# Patient Record
Sex: Male | Born: 1975 | Race: White | Hispanic: No | Marital: Married | Smoking: Former smoker
Health system: Southern US, Community
[De-identification: ages and names within clinical notes are randomized; demographics above are authoritative.]

## PROBLEM LIST (undated history)

## (undated) DIAGNOSIS — K7460 Unspecified cirrhosis of liver: Secondary | ICD-10-CM

## (undated) DIAGNOSIS — B1920 Unspecified viral hepatitis C without hepatic coma: Secondary | ICD-10-CM

## (undated) HISTORY — DX: Unspecified cirrhosis of liver: K74.60

## (undated) HISTORY — PX: EXTERNAL EAR SURGERY: SHX627

## (undated) HISTORY — DX: Unspecified viral hepatitis C without hepatic coma: B19.20

---

## 1995-07-27 DIAGNOSIS — B1920 Unspecified viral hepatitis C without hepatic coma: Secondary | ICD-10-CM

## 1995-07-27 HISTORY — DX: Unspecified viral hepatitis C without hepatic coma: B19.20

## 2014-08-23 ENCOUNTER — Ambulatory Visit (INDEPENDENT_AMBULATORY_CARE_PROVIDER_SITE_OTHER): Payer: 59 | Admitting: Family Medicine

## 2014-08-23 ENCOUNTER — Encounter: Payer: Self-pay | Admitting: Family Medicine

## 2014-08-23 VITALS — BP 141/87 | HR 70 | Ht 71.0 in | Wt 228.0 lb

## 2014-08-23 DIAGNOSIS — E209 Hypoparathyroidism, unspecified: Secondary | ICD-10-CM | POA: Insufficient documentation

## 2014-08-23 DIAGNOSIS — M1710 Unilateral primary osteoarthritis, unspecified knee: Secondary | ICD-10-CM | POA: Insufficient documentation

## 2014-08-23 DIAGNOSIS — F1191 Opioid use, unspecified, in remission: Secondary | ICD-10-CM | POA: Insufficient documentation

## 2014-08-23 DIAGNOSIS — Z87898 Personal history of other specified conditions: Secondary | ICD-10-CM | POA: Insufficient documentation

## 2014-08-23 DIAGNOSIS — B182 Chronic viral hepatitis C: Secondary | ICD-10-CM

## 2014-08-23 DIAGNOSIS — M13169 Monoarthritis, not elsewhere classified, unspecified knee: Secondary | ICD-10-CM

## 2014-08-23 DIAGNOSIS — D696 Thrombocytopenia, unspecified: Secondary | ICD-10-CM | POA: Insufficient documentation

## 2014-08-23 DIAGNOSIS — Z8659 Personal history of other mental and behavioral disorders: Secondary | ICD-10-CM

## 2014-08-23 DIAGNOSIS — Z2252 Carrier of viral hepatitis C: Secondary | ICD-10-CM

## 2014-08-23 LAB — COMPLETE METABOLIC PANEL WITH GFR
ALBUMIN: 4.4 g/dL (ref 3.5–5.2)
ALT: 99 U/L — ABNORMAL HIGH (ref 0–53)
AST: 49 U/L — ABNORMAL HIGH (ref 0–37)
Alkaline Phosphatase: 54 U/L (ref 39–117)
BUN: 15 mg/dL (ref 6–23)
CO2: 28 mEq/L (ref 19–32)
Calcium: 9.2 mg/dL (ref 8.4–10.5)
Chloride: 103 mEq/L (ref 96–112)
Creat: 0.86 mg/dL (ref 0.50–1.35)
GFR, Est African American: >89 mL/min
Glucose, Bld: 88 mg/dL (ref 70–99)
Potassium: 4.4 mEq/L (ref 3.5–5.3)
SODIUM: 141 meq/L (ref 135–145)
Total Bilirubin: 0.7 mg/dL (ref 0.2–1.2)
Total Protein: 7 g/dL (ref 6.0–8.3)

## 2014-08-23 LAB — CBC
HCT: 46.4 % (ref 39.0–52.0)
Hemoglobin: 16.2 g/dL (ref 13.0–17.0)
MCH: 30.9 pg (ref 26.0–34.0)
MCHC: 34.9 g/dL (ref 30.0–36.0)
MCV: 88.5 fL (ref 78.0–100.0)
MPV: 12.7 fL — ABNORMAL HIGH (ref 8.6–12.4)
Platelets: 161 10*3/uL (ref 150–400)
RBC: 5.24 MIL/uL (ref 4.22–5.81)
RDW: 13.6 % (ref 11.5–15.5)
WBC: 4.3 10*3/uL (ref 4.0–10.5)

## 2014-08-23 NOTE — Progress Notes (Signed)
CC: Jimmy Gonzalez is a 39 y.o. 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 He has not used any intravenous drugs or abused any medications since rehabilitation 15 years ago. He denies right upper quadrant pain jaundice  Nor nausea recently or remotely.  On outside records  It shows that he has a genotype 1b  And in 2011 his hep C RNA quantitative  Result was 30,000. He tells me that he has never been offered therapy because of  A chronic leukocytosis. He was told to follow-up with hepatology in 2016.  Most recent LFTs are from 2013 showing a mildly elevated ALT and normal AST.   history of hyperparathyroidism that was found  About 5 years ago during a hypocalcemia workup. He currently sees an endocrinologist in [place] and is taking calcitriol. Most recent calcium level in  February 2015 was 8.4.  He denies any recent motor or sensory disturbances   history of thrombocytopenia and  Leukopenia. He tells me that he had a bone marrow biopsy sometime around 2010 that was normal  And that these findings were attribute mild splenomegaly.   Complains of bilateral knee pain right greater than left that comes and goes for the last couple years. Most recent episode occurred around the holidays and lasted a week. It typically lasts a week proceeded by any particular event or actions. Will go away within a week without any particular interventions. It is accompanied by no swelling redness or warmth or instability when it occurs. He denies any recent or remote trauma or overexertion. When it occurred most recently it was worse with climbing up stairs, walking but absent when at rest. No workup has been done for this.   Review of Systems - General ROS: negative for - chills, fever, night sweats, weight gain or weight loss Ophthalmic  ROS: negative for - decreased vision Psychological ROS: negative for - anxiety or depression ENT ROS: negative for - hearing change, nasal congestion, tinnitus or allergies Hematological and Lymphatic ROS: negative for - bleeding problems, bruising or swollen lymph nodes Breast ROS: negative Respiratory ROS: no cough, shortness of breath, or wheezing Cardiovascular ROS: no chest pain or dyspnea on exertion Gastrointestinal ROS: no abdominal pain, change in bowel habits, or black or bloody stools Genito-Urinary ROS: negative for - genital discharge, genital ulcers, incontinence or abnormal bleeding from genitals Musculoskeletal ROS: negative for -current joint pain or muscle pain Neurological ROS: negative for - headaches or memory loss Dermatological ROS: negative for lumps, mole changes, rash and skin lesion changes  Past Medical History  Diagnosis Date  . Hepatitis C 1997    Past Surgical History  Procedure Laterality Date  . External ear surgery  [phone]   History reviewed. No pertinent family history.  History   Social History  . Marital Status: Married    Spouse Name: N/A    Number of Children: N/A  . Years of Education: N/A   Occupational History  . Not on file.   Social History Main Topics  . Smoking status: Former Research scientist (life sciences)  . Smokeless tobacco: Not on file  . Alcohol Use: No  . Drug Use: No  . Sexual Activity:    Partners: Female   Other Topics Concern  . Not on file   Social History Narrative  .  No narrative on file     Objective: BP 141/87 mmHg  Pulse 70  Ht $R'5\' 11"'Ce$  (1.803 m)  Wt 228 lb (103.42 kg)  BMI 31.81 kg/m2  General: Alert and Oriented, No Acute Distress HEENT: Pupils equal, round, reactive to light. Conjunctivae clear.  Moist mucous membranes Lungs: Clear to auscultation bilaterally, no wheezing/ronchi/rales.  Comfortable work of breathing. Good air movement. Cardiac: Regular rate and rhythm. Normal S1/S2.  No murmurs, rubs, nor  gallops.   Abdomen: Normal bowel sounds, soft and non tender without palpable masses. Right knee exam shows full-strength and range of motion. There is no swelling, redness, nor warmth overlying the knee.  No patellar crepitus. No patellar apprehension. No pain with palpation of the inferior patellar pole.  No pain or laxity with valgus nor varus stress. Anterior drawer is negative. McMurray's negative. No popliteal space tenderness or palpable mass. No medial or lateral joint line tenderness to palpation. Genu Varus Extremities: No peripheral edema.  Strong peripheral pulses.  Mental Status: No depression, anxiety, nor agitation. Skin: Warm and dry.  Assessment & Plan: Jimmy Gonzalez was seen today for establish care.  Diagnoses and associated orders for this visit:  Hepatitis C virus carrier state - COMPLETE METABOLIC PANEL WITH GFR - AMB referral to hepatitis C clinic  Hypoparathyroidism - COMPLETE METABOLIC PANEL WITH GFR  Thrombocytopenia - CBC  Patellofemoral arthritis, unspecified laterality  History of heroin use    Hepatitis C: We'll refer to [facility] for further management Hypoparathyroidism: Clinically controlled due for calcium and albumin Thrombocytopenia: Clinically controlled due for CBC Patellofemoral arthritis: Provided with a home exercise plan to help prevent future episodes of pain and that can be used when any flareup occurs.   Return in about 3 months (around 11/22/2014) for Routine Follow Up.

## 2014-08-28 ENCOUNTER — Encounter: Payer: Self-pay | Admitting: Family Medicine

## 2014-11-08 ENCOUNTER — Other Ambulatory Visit (HOSPITAL_COMMUNITY): Payer: Self-pay | Admitting: Nurse Practitioner

## 2014-11-08 ENCOUNTER — Other Ambulatory Visit (HOSPITAL_COMMUNITY): Payer: Self-pay | Admitting: Family Medicine

## 2014-11-08 DIAGNOSIS — B182 Chronic viral hepatitis C: Secondary | ICD-10-CM

## 2014-11-22 ENCOUNTER — Ambulatory Visit (INDEPENDENT_AMBULATORY_CARE_PROVIDER_SITE_OTHER): Payer: 59 | Admitting: Family Medicine

## 2014-11-22 ENCOUNTER — Encounter: Payer: Self-pay | Admitting: Family Medicine

## 2014-11-22 VITALS — BP 127/84 | HR 78 | Wt 228.0 lb

## 2014-11-22 DIAGNOSIS — H6121 Impacted cerumen, right ear: Secondary | ICD-10-CM | POA: Diagnosis not present

## 2014-11-22 DIAGNOSIS — Z2252 Carrier of viral hepatitis C: Secondary | ICD-10-CM | POA: Diagnosis not present

## 2014-11-22 DIAGNOSIS — M13169 Monoarthritis, not elsewhere classified, unspecified knee: Secondary | ICD-10-CM | POA: Diagnosis not present

## 2014-11-22 DIAGNOSIS — B182 Chronic viral hepatitis C: Secondary | ICD-10-CM

## 2014-11-22 NOTE — Progress Notes (Signed)
CC: Jimmy Gonzalez is a 39 y.o. male is here for Cerumen Impaction   Subjective: HPI:  Follow-up hepatitis C: He has begun seeing Jimmy Gonzalez at the hepatology clinic. As it stands now he will begin treatment in June. He is very optimistic about overcoming his hepatitis C infection. He is not using any intravenous drugs or alcohol. No right upper quadrant pain or jaundice. Denies any gastro-intestinal complaints  Follow-up patellofemoral syndrome: Since I saw him last his begin doing some of the exercises that were given on a rehabilitation plan he was provided with. He's also stopped doing a certain position (that he doesn't want to elaborate on) while having sex with his wife and has noticed that this is taking a large strain off of his knees and he is no longer having any symptoms. He denies any pain in the knees or swelling or overlying skin changes  Complains of decreased hearing in the right ear with a full sensation. He tells me that this happens once a year and is usually due to the cerumen impaction. He denies any complete loss of hearing, tinnitus, headache, balance issues nor drainage from the right ear.    Review Of Systems Outlined In HPI  Past Medical History  Diagnosis Date  . Hepatitis C 1997    Past Surgical History  Procedure Laterality Date  . External ear surgery  [phone],1986,2003   No family history on file.  History   Social History  . Marital Status: Married    Spouse Name: N/A  . Number of Children: N/A  . Years of Education: N/A   Occupational History  . Not on file.   Social History Main Topics  . Smoking status: Former Games developermoker  . Smokeless tobacco: Not on file  . Alcohol Use: No  . Drug Use: No  . Sexual Activity:    Partners: Female   Other Topics Concern  . Not on file   Social History Narrative     Objective: BP 127/84 mmHg  Pulse 78  Wt 228 lb (103.42 kg)  SpO2 97%  General: Alert and Oriented, No Acute Distress HEENT: Pupils equal,  round, reactive to light. Conjunctivae clear.  Left canal clear with intact TMswith appropriate landmarks.  Middle ear appears open without effusion. Right middle ear and ear canal is secured by cerumen impaction. Pink inferior turbinates.  Moist mucous membranes, pharynx without inflammation nor lesions.  Neck supple without palpable lymphadenopathy nor abnormal masses. Lungs: Clear to auscultation bilaterally, no wheezing/ronchi/rales.  Comfortable work of breathing. Good air movement. Cardiac: Regular rate and rhythm. Normal S1/S2.  No murmurs, rubs, nor gallops.   Extremities: No peripheral edema.  Strong peripheral pulses.  Mental Status: No depression, anxiety, nor agitation. Skin: Warm and dry.  Assessment & Plan: Jimmy Gonzalez was seen today for cerumen impaction.  Diagnoses and all orders for this visit:  Hepatitis C virus carrier state  Patellofemoral arthritis, unspecified laterality  Cerumen impaction, right Orders: -     Ambulatory referral to ENT   Hepatitis C: Currently asymptomatic, advised to follow-up with Jimmy Gonzalez in June for initiation of treatment Patellofemoral arthritis: Completely resolved, restart home rehabilitation if pain returns Cerumen impaction: He tells me that he's been instructed to never have his right ear irrigated due to complex anatomy, he is requesting referral to ENT which seems quite reasonable.  Return if symptoms worsen or fail to improve.

## 2014-11-27 ENCOUNTER — Ambulatory Visit (HOSPITAL_COMMUNITY)
Admission: RE | Admit: 2014-11-27 | Discharge: 2014-11-27 | Disposition: A | Discharge status: Home / Self Care | Payer: 59 | Source: Ambulatory Visit | Attending: Nurse Practitioner | Admitting: Nurse Practitioner

## 2014-11-27 DIAGNOSIS — Z87891 Personal history of nicotine dependence: Secondary | ICD-10-CM | POA: Diagnosis not present

## 2014-11-27 DIAGNOSIS — B1920 Unspecified viral hepatitis C without hepatic coma: Secondary | ICD-10-CM | POA: Insufficient documentation

## 2014-11-27 DIAGNOSIS — B182 Chronic viral hepatitis C: Secondary | ICD-10-CM

## 2014-12-17 ENCOUNTER — Encounter: Payer: Self-pay | Admitting: Family Medicine

## 2014-12-17 DIAGNOSIS — H6621 Chronic atticoantral suppurative otitis media, right ear: Secondary | ICD-10-CM | POA: Insufficient documentation

## 2015-01-16 ENCOUNTER — Telehealth: Payer: Self-pay | Admitting: *Deleted

## 2015-01-16 NOTE — Telephone Encounter (Signed)
Called patient regarding appt for Hep C and left a voice mail to call the clinic. He can be added to the July 13 schedule. Wendall Mola

## 2015-02-05 ENCOUNTER — Encounter: Payer: Self-pay | Admitting: Internal Medicine

## 2015-02-05 ENCOUNTER — Encounter: Payer: 59 | Admitting: Internal Medicine

## 2015-02-05 ENCOUNTER — Ambulatory Visit (INDEPENDENT_AMBULATORY_CARE_PROVIDER_SITE_OTHER): Payer: 59 | Admitting: Internal Medicine

## 2015-02-05 VITALS — BP 142/89 | HR 85 | Temp 97.9°F | Ht 71.0 in | Wt 227.0 lb

## 2015-02-05 DIAGNOSIS — B182 Chronic viral hepatitis C: Secondary | ICD-10-CM | POA: Diagnosis not present

## 2015-02-05 DIAGNOSIS — K7460 Unspecified cirrhosis of liver: Secondary | ICD-10-CM | POA: Diagnosis not present

## 2015-02-05 DIAGNOSIS — D696 Thrombocytopenia, unspecified: Secondary | ICD-10-CM

## 2015-02-05 MED ORDER — LEDIPASVIR-SOFOSBUVIR 90-400 MG PO TABS
1.0000 | ORAL_TABLET | Freq: Every day | ORAL | Status: DC
Start: 2015-02-05 — End: 2015-07-29

## 2015-02-05 NOTE — Addendum Note (Signed)
Addended by: Gardiner BarefootOMER, ROBERT W on: 02/05/2015 04:47 PM   Modules accepted: Orders

## 2015-02-05 NOTE — Patient Instructions (Signed)
Date 02/05/2015  Dear Mr. Caralee AtesAndrews, As discussed in the ID Clinic, your hepatitis C therapy will include the following medications:          Harvoni 90mg /400mg  tablet:           Take 1 tablet by mouth once daily   Please note that ALL MEDICATIONS WILL START ON THE SAME DATE for a total of 12 weeks. ---------------------------------------------------------------- Your HCV Treatment Start Date: TBA   Your HCV genotype:  1b    Liver Fibrosis: F4    ---------------------------------------------------------------- YOUR PHARMACY CONTACT:   [facility] [address] Phone: [phone] Hours: Monday to Friday 7:30 am to 6:00 pm   Please always contact your pharmacy at least 3-4 business days before you run out of medications to ensure your next month's medication is ready or 1 week prior to running out if you receive it by mail.  Remember, each prescription is for 28 days. ---------------------------------------------------------------- GENERAL NOTES REGARDING YOUR HEPATITIS C MEDICATION:  SOFOSBUVIR/LEDIPASVIR (HARVONI): - Harvoni tablet is taken daily with OR without food. - The tablets are orange. - The tablets should be stored at room temperature.  - Acid reducing agents such as H2 blockers (ie. Pepcid (famotidine), Zantac (ranitidine), Tagamet (cimetidine), Axid (nizatidine) and proton pump inhibitors (ie. Prilosec (omeprazole), Protonix (pantoprazole), Nexium (esomeprazole), or Aciphex (rabeprazole)) can decrease effectiveness of Harvoni. Do not take until you have discussed with a health care provider.    -Antacids that contain magnesium and/or aluminum hydroxide (ie. Milk of Magensia, Rolaids, Gaviscon, Maalox, Mylanta, an dArthritis Pain Formula)can reduce absorption of Harvoni, so take them at least 4 hours before or after Harvoni.  -Calcium carbonate (calcium supplements or antacids such as Tums, Caltrate,  Os-Cal)needs to be taken at least 4 hours hours before or after Harvoni.  -St. John's wort or any products that contain St. John's wort like some herbal supplements  Please inform the office prior to starting any of these medications.  - The common side effects with Harvoni:      1. Fatigue      2. Headache      3. Nausea      4. Diarrhea      5. Insomnia   Support Path is a suite of resources designed to help patients start with HARVONI and move toward treatment completion GETTING STARTED Support Path helps patients access therapy and get off to an efficient start  Benefits investigation and prior authorization support Co-pay and other financial assistance A specialty pharmacy finder CO-PAY COUPON The HARVONI co-pay coupon may help eligible patients lower their out-of-pocket costs. With a co-pay coupon, most eligible patients may pay no more than $5 per co-pay (restrictions apply) www.harvoni.com call 70233735031-757-816-2739 Not valid for patients enrolled in government healthcare prescription drug programs, such as Medicare Part D and Medicaid. Patients in the coverage gap known as the "donut hole" also are not eligible The HARVONI co-pay coupon program will cover the out-of-pocket costs for HARVONI prescriptions up to a maximum of 25% of the catalog price of a 12-week regimen of HARVONI  Please note that this only lists the most common side effects and is NOT a comprehensive list of the potential side effects of these medications. For more information, please review the drug information sheets that come with your medication package from the pharmacy.  ---------------------------------------------------------------- GENERAL HELPFUL HINTS ON HCV THERAPY: 1. No alcohol. 2. Protect against sun-sensitivity/sunburns (wear sunglasses, hat, long sleeves, pants  and sunscreen). 3. Stay well-hydrated/well-moisturized. 4. Notify the ID Clinic of any changes in your other over-the-counter/herbal or  prescription medications. 5. If you miss a dose of your medication, take the missed dose as soon as you remember. Return to your regular time/dose schedule the next day.  6.  Do not stop taking your medications without first talking with your healthcare provider. 7.  You may take Tylenol (acetaminophen), as long as the dose is less than 2000 mg (OR no more than 4 tablets of the Tylenol Extra Strengths '500mg'$  tablet) in 24 hours. 8.  You will need to obtain routine labs and/or office visits at [facility] at weeks 4 and 12 as well as 12 and 24 weeks after completion of treatment.   Scharlene Gloss, [place] for [place] [address] [place], [place]  [phone]

## 2015-02-05 NOTE — Progress Notes (Signed)
+Jimmy Gonzalez is a 39 y.o. male who presents for initial evaluation and management of a positive Hepatitis C antibody test.  Patient tested positive in 2000. Hepatitis C risk factors present are: IV drug abuse (details: last used in 2000). Patient denies multiple sexual partners, renal dialysis, sexual contact with person with liver disease, tattoos. Patient has had other studies performed. Results: hepatitis C RNA by PCR, result: positive. Patient has not had prior treatment for Hepatitis C. Patient does not have a past history of liver disease. Patient does not have a family history of liver disease.   HPI: Here to establish Gonzalez for hepatitis C.  Was initially sent out of network to Jimmy Gonzalez but here to continue evaluation for hepatitis C treatment.  He has had elastography notable for F4 and F3, though Fibrosure with F2/A3.  LFTs have been chronically elevated.  [facility] 2.3 and 138, respectively, though in January were normal (in Epic). Other labs reviewed and is genotype 1b.        Patient does not have documented immunity to Hepatitis A. Patient does have documented immunity to Hepatitis B.     Review of Systems A comprehensive review of systems was negative.   Past Medical History  Diagnosis Date  . Hepatitis C 1997    Prior to Admission medications   Medication Sig Start Date End Date Taking? Authorizing Provider  calcitRIOL (ROCALTROL) 0.25 MCG capsule Take 0.25 mcg by mouth daily. Take two tabs twice a day   Yes Historical Provider, MD  Calcium Carbonate Antacid (TUMS PO) Take by mouth.   Yes Historical Provider, MD  loratadine (CLARITIN) 10 MG tablet Take 10 mg by mouth daily.   Yes Historical Provider, MD  Ledipasvir-Sofosbuvir (HARVONI) 90-400 MG TABS Take 1 tablet by mouth daily. 02/05/15   Jimmy Barefootobert W Jimmy Mehlhoff, MD    No Known Allergies  History  Substance Use Topics  . Smoking status: Former Games developermoker  . Smokeless tobacco: Never  Used  . Alcohol Use: No    History reviewed. No pertinent family history.    Objective:   Filed Vitals:   02/05/15 1542  BP: 142/89  Pulse: 85  Temp: 97.9 F (36.6 C)   in no apparent distress and alert HEENT: anicteric Cor RRR and No murmurs clear Bowel sounds are normal, liver is not enlarged, spleen is not enlarged peripheral pulses normal, no pedal edema, no clubbing or cyanosis negative for - jaundice, spider hemangioma, telangiectasia, palmar erythema, ecchymosis and atrophy  Laboratory Genotype: No results found for: HCVGENOTYPE HCV viral load: No results found for: HCVQUANT Lab Results  Component Value Date   WBC 4.3 08/23/2014   HGB 16.2 08/23/2014   HCT 46.4 08/23/2014   MCV 88.5 08/23/2014   PLT 161 08/23/2014    Lab Results  Component Value Date   CREATININE 0.86 08/23/2014   BUN 15 08/23/2014   NA 141 08/23/2014   K 4.4 08/23/2014   CL 103 08/23/2014   CO2 28 08/23/2014    Lab Results  Component Value Date   ALT 99* 08/23/2014   AST 49* 08/23/2014   ALKPHOS 54 08/23/2014   BILITOT 0.7 08/23/2014      Assessment: Chronic Hepatitis C genotype 1b  Plan: 1) Patient counseled extensively on limiting acetaminophen to no more than 2 grams daily, avoidance of alcohol. 2) Transmission discussed with patient including sexual transmission, sharing razors and toothbrush.   3) Will need referral to  gastroenterology, though he has discordant results with F3/4 on elastography and F2 on Fibrosure, with low platelets (scanned results) and elastography, I think varices screening is indicated.  I also discussed HCC screening every 6 months and will consider repeat elastography in 1 year.   4) Will need referral for substance abuse counseling: No. 5) Will prescribe Harvoni for 12 weeks  6) Hepatitis A vaccine Yes.  will do next visit.  7) Hepatitis B vaccine No.  He is core positive and DNA checked, is negative.   8) Pneumovax vaccine next visit due to possible  cirrhosis.   9) will follow up after starting medication   Labs from [facility] scanned in.

## 2015-02-10 ENCOUNTER — Encounter: Payer: Self-pay | Admitting: Internal Medicine

## 2015-02-23 ENCOUNTER — Emergency Department (INDEPENDENT_AMBULATORY_CARE_PROVIDER_SITE_OTHER): Payer: 59

## 2015-02-23 ENCOUNTER — Encounter: Payer: Self-pay | Admitting: Emergency Medicine

## 2015-02-23 ENCOUNTER — Emergency Department
Admission: EM | Admit: 2015-02-23 | Discharge: 2015-02-23 | Disposition: A | Discharge status: Home / Self Care | Payer: 59 | Source: Home / Self Care | Attending: Family Medicine | Admitting: Family Medicine

## 2015-02-23 DIAGNOSIS — R6889 Other general symptoms and signs: Secondary | ICD-10-CM | POA: Diagnosis not present

## 2015-02-23 DIAGNOSIS — J069 Acute upper respiratory infection, unspecified: Secondary | ICD-10-CM

## 2015-02-23 DIAGNOSIS — R509 Fever, unspecified: Secondary | ICD-10-CM

## 2015-02-23 DIAGNOSIS — R05 Cough: Secondary | ICD-10-CM | POA: Diagnosis not present

## 2015-02-23 MED ORDER — BENZONATATE 100 MG PO CAPS: 100.0000 mg | ORAL_CAPSULE | Freq: Three times a day (TID) | ORAL | Status: DC

## 2015-02-23 MED ORDER — DM-GUAIFENESIN ER 30-600 MG PO TB12: 1.0000 | ORAL_TABLET | Freq: Two times a day (BID) | ORAL | Status: DC

## 2015-02-23 NOTE — ED Notes (Signed)
Patient presents to [facility] with cough cold and congestion times 4 days. No fever patients wife is in the hospital with pneumonia

## 2015-02-23 NOTE — ED Provider Notes (Signed)
CSN: [id number]     Arrival date & time 02/23/15  1234 History   First MD Initiated Contact with Patient 02/23/15 1314     Chief Complaint  Patient presents with  . Nasal Congestion   (Consider location/radiation/quality/duration/timing/severity/associated sxs/prior Treatment) HPI The patient is a 39 year old male presenting to urgent care with complaints of flulike symptoms and is concerned he has pneumonia as his wife was recently admitted for pneumonia.  Patient states he has had nasal congestion, mild to moderately productive cough that is intermittent.  States he also has mild intermittent wheeze and shortness of breath, chest pain from coughing.  He has had nausea and small amount of loose stools.  Patient has had decreased appetite, fatigue and body aches.  He has also had headaches in dizziness.  Symptoms started about 4 days ago.  Patient states he did have a temperature up to 101.3, but that has since resolved.  States he has been taking acetaminophen and ibuprofen which has helped with symptoms.  Symptoms have been gradually improving.  However, he wanted to be evaluated just to make sure he does not have pneumonia.  Denies recent travel.  History of asthma.  Patient is a former smoker.  Past Medical History  Diagnosis Date  . Hepatitis C 1997   Past Surgical History  Procedure Laterality Date  . External ear surgery  [phone]   History reviewed. No pertinent family history. History  Substance Use Topics  . Smoking status: Former Games developer  . Smokeless tobacco: Never Used  . Alcohol Use: No    Review of Systems  Constitutional: Positive for fever ( 101.3), chills, diaphoresis, appetite change and fatigue.  HENT: Positive for congestion, sore throat and voice change ( hoarse). Negative for ear pain and trouble swallowing.   Respiratory: Positive for cough, shortness of breath and wheezing.   Cardiovascular: Positive for chest pain ( "from coughing"). Negative for  palpitations.  Gastrointestinal: Positive for nausea and diarrhea. Negative for vomiting and abdominal pain.  Musculoskeletal: Negative for myalgias, back pain and arthralgias.  Skin: Negative for rash.  Neurological: Positive for dizziness and headaches. Negative for light-headedness.  All other systems reviewed and are negative.   Allergies  Review of patient's allergies indicates no known allergies.  Home Medications   Prior to Admission medications   Medication Sig Start Date End Date Taking? Authorizing Provider  benzonatate (TESSALON) 100 MG capsule Take 1 capsule (100 mg total) by mouth every 8 (eight) hours. 02/23/15   Junius Finner, PA-C  calcitRIOL (ROCALTROL) 0.25 MCG capsule Take 0.25 mcg by mouth daily. Take two tabs twice a day    Historical Provider, MD  Calcium Carbonate Antacid (TUMS PO) Take by mouth.    Historical Provider, MD  dextromethorphan-guaiFENesin (MUCINEX DM) 30-600 MG per 12 hr tablet Take 1 tablet by mouth 2 (two) times daily. Please drink a large glass of water with each dose 02/23/15   Junius Finner, PA-C  Ledipasvir-Sofosbuvir (HARVONI) 90-400 MG TABS Take 1 tablet by mouth daily. 02/05/15   Gardiner Barefoot, MD  loratadine (CLARITIN) 10 MG tablet Take 10 mg by mouth daily.    Historical Provider, MD   BP 135/89 mmHg  Pulse 86  Temp(Src) 98 F (36.7 C) (Oral)  Resp 16  Ht 5\' 11"  (1.803 m)  Wt 223 lb 4 oz (101.266 kg)  BMI 31.15 kg/m2  SpO2 98% Physical Exam  Constitutional: He appears well-developed and well-nourished.  HENT:  Head: Normocephalic and atraumatic.  Right Ear: Hearing,  tympanic membrane, external ear and ear canal normal.  Left Ear: Hearing, tympanic membrane, external ear and ear canal normal.  Nose: Mucosal edema present.  Mouth/Throat: Uvula is midline, oropharynx is clear and moist and mucous membranes are normal.  Eyes: Conjunctivae are normal. No scleral icterus.  Neck: Normal range of motion. Neck supple.  Cardiovascular:  Normal rate, regular rhythm and normal heart sounds.   Pulmonary/Chest: Effort normal. No respiratory distress. He has no wheezes. He has rales. He exhibits no tenderness.  No respiratory distress, able to speak in full sentences w/o difficulty. No accessory muscle use. Mild intermittent productive cough. Lungs: mild diffuse rhonchi, no wheeze.   Abdominal: Soft. Bowel sounds are normal. He exhibits no distension and no mass. There is no tenderness. There is no rebound and no guarding.  Musculoskeletal: Normal range of motion.  Neurological: He is alert.  Skin: Skin is warm and dry.  Nursing note and vitals reviewed.   ED Course  Procedures (including critical care time) Labs Review Labs Reviewed - No data to display  Imaging Review Dg Chest 2 View  02/23/2015   CLINICAL DATA:  Cough x 4 days, fever, chest pressure and sob, ex-smoker x 16 yrs  EXAM: CHEST  2 VIEW  COMPARISON:  None.  FINDINGS: Normal mediastinum and cardiac silhouette. Normal pulmonary vasculature. No evidence of effusion, infiltrate, or pneumothorax. No acute bony abnormality.  IMPRESSION: Normal chest radiograph.   Electronically Signed   By: Genevive Bi M.D.   On: 02/23/2015 14:06     MDM   1. Acute upper respiratory infection   2. Flu-like symptoms    Flu-like symptoms. Wife recently admitted for pneumonia. Pt appears well, non-toxic. Afebrile. Rhonchi on Lung exam w/o respiratory distress. CXR: normal chest, no evidence of effusion, infiltrate or pneumothorax. Symptoms only being 4 days and improving per pt, will continue to tx symptomatically.  Likely viral URI. Advised pt to use acetaminophen and ibuprofen as needed for fever and pain. Rx: mucinex and tessalon Encouraged rest and fluids. F/u with PCP later this week if not improving. Return precautions provided. Pt verbalized understanding and agreement with tx plan.     Junius Finner, PA-C 02/23/15 1454

## 2015-02-23 NOTE — Discharge Instructions (Signed)

## 2015-02-27 ENCOUNTER — Telehealth: Payer: Self-pay | Admitting: Lab

## 2015-02-27 NOTE — Telephone Encounter (Signed)
See contact notes 

## 2015-03-21 ENCOUNTER — Ambulatory Visit (INDEPENDENT_AMBULATORY_CARE_PROVIDER_SITE_OTHER): Payer: 59 | Admitting: Internal Medicine

## 2015-03-21 ENCOUNTER — Encounter: Payer: Self-pay | Admitting: Internal Medicine

## 2015-03-21 VITALS — BP 118/76 | HR 72 | Ht 71.5 in | Wt 227.4 lb

## 2015-03-21 DIAGNOSIS — B182 Chronic viral hepatitis C: Secondary | ICD-10-CM | POA: Diagnosis not present

## 2015-03-21 DIAGNOSIS — K769 Liver disease, unspecified: Secondary | ICD-10-CM | POA: Diagnosis not present

## 2015-03-21 DIAGNOSIS — R932 Abnormal findings on diagnostic imaging of liver and biliary tract: Secondary | ICD-10-CM | POA: Diagnosis not present

## 2015-03-21 NOTE — Patient Instructions (Signed)
You have been scheduled for an endoscopy. Please follow written instructions given to you at your visit today. If you use inhalers (even only as needed), please bring them with you on the day of your procedure.  I appreciate the opportunity to care for you.  

## 2015-03-21 NOTE — Progress Notes (Signed)
  Referred by Dr. Luciana Axe Subjective:    Patient ID: Jimmy Gonzalez, male    DOB: [birth year], 39 y.o.   MRN: [id number] Cc: ? 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 Has hx of thrombocytopenia and leukopenia - waxing and waning and also borderline splenomegaly. Here re: ? Portal hypertension, varices. GI ROS o/w neg Medications, allergies, past medical history, past surgical history, family history and social history are reviewed and updated in the EMR.   Review of Systems Rhinorrhea w/initiation HCV TX and then some mild headaches  All other ROS negative    Objective:   Physical Exam  118/76 mmHg  Pulse 72  Ht 5' 11.5" (1.816 m)  Wt 227 lb 6 oz (103.137 kg)  BMI 31.27 kg/m2@  General:  Well-developed, well-nourished and in no acute distress Eyes:  anicteric. Lungs: Clear to auscultation bilaterally. Heart:  S1S2, no rubs, murmurs, gallops. Abdomen:  soft, non-tender, + spleen tip palable, nohernia, or mass and BS+. . Skin   no rash. No stigmata of liver dz.  Psych:  appropriate mood and  Affect.   Data Reviewed: [place] w/ elastography [facility] notes Labs  All in EMR    Assessment & Plan:  Abnormal liver ultrasound - Plan: Ambulatory referral to Gastroenterology  Chronic liver disease - Plan: Ambulatory referral to Gastroenterology  Chronic hepatitis C without hepatic coma - Plan: Ambulatory referral to Gastroenterology  Seems like a low but real possibility of having varices so will screen - elastography probably over estimating fibrosis - may have some fatty liver also Have discussed w/ patient and Annamarie Major, NP - plan for EGD to screen fr varices/portal gastropathy  I appreciate the opportunity to care for this patient. Cc: Merceda Elks, MD; Annamarie Major, NP and The Patient

## 2015-03-21 NOTE — Assessment & Plan Note (Signed)
On Tx

## 2015-04-10 ENCOUNTER — Ambulatory Visit (AMBULATORY_SURGERY_CENTER): Payer: 59 | Admitting: Internal Medicine

## 2015-04-10 ENCOUNTER — Encounter: Payer: Self-pay | Admitting: Internal Medicine

## 2015-04-10 VITALS — BP 116/78 | HR 70 | Temp 97.0°F | Resp 20 | Ht 71.5 in | Wt 227.0 lb

## 2015-04-10 DIAGNOSIS — K7460 Unspecified cirrhosis of liver: Secondary | ICD-10-CM

## 2015-04-10 DIAGNOSIS — R932 Abnormal findings on diagnostic imaging of liver and biliary tract: Secondary | ICD-10-CM

## 2015-04-10 MED ORDER — SODIUM CHLORIDE 0.9 % IV SOLN
500.0000 mL | INTRAVENOUS | Status: DC
Start: 2015-04-10 — End: 2015-04-11

## 2015-04-10 NOTE — Op Note (Signed)
[facility] [address] [place], [address]   ENDOSCOPY PROCEDURE REPORT  PATIENT: Jimmy Gonzalez, Jimmy Gonzalez  MR#: [id number] BIRTHDATE: [birth year] , 39  yrs. old GENDER: male ENDOSCOPIST: Iva Boop, MD, [facility] PROCEDURE DATE:  04/10/2015 PROCEDURE:  EGD, diagnostic and EGD, screening ASA CLASS:     Class III INDICATIONS:  screening for varices. MEDICATIONS: Propofol 160 mg IV TOPICAL ANESTHETIC: none  DESCRIPTION OF PROCEDURE: After the risks benefits and alternatives of the procedure were thoroughly explained, informed consent was obtained.  The LB [id number] endoscope was introduced through the mouth and advanced to the second portion of the duodenum , Without limitations.  The instrument was slowly withdrawn as the mucosa was fully examined.      EXAM: The esophagus and gastroesophageal junction were completely normal in appearance.  The stomach was entered and closely examined.The antrum, angularis, and lesser curvature were well visualized, including a retroflexed view of the cardia and fundus. The stomach wall was normally distensable.  The scope passed easily through the pylorus into the duodenum.  Retroflexed views revealed no abnormalities.     The scope was then withdrawn from the patient and the procedure completed.  COMPLICATIONS: There were no immediate complications.  ENDOSCOPIC IMPRESSION: Normal appearing esophagus and GE junction, the stomach was well visualized and normal in appearance, normal appearing duodenum  RECOMMENDATIONS: Repeat EGD 3 yrs (Has HCV cirrhosis - Child's A) - sooner if decompensates    eSigned:  Iva Boop, MD, [facility] 04/10/2015 10:18 AM    CC:The Patient and Annamarie Major, NP

## 2015-04-10 NOTE — Patient Instructions (Addendum)
This looks ok  Should repeat an endoscopy in 3 years - sooner if you have decompensation of liver disease.  I appreciate the opportunity to care for you. Iva Boop, MD, [facility]  Discharge instructions given. Normal exam. Resume previous medications. YOU HAD AN ENDOSCOPIC PROCEDURE TODAY AT THE [facility]:   Refer to the procedure report that was given to you for any specific questions about what was found during the examination.  If the procedure report does not answer your questions, please call your gastroenterologist to clarify.  If you requested that your care partner not be given the details of your procedure findings, then the procedure report has been included in a sealed envelope for you to review at your convenience later.  YOU SHOULD EXPECT: Some feelings of bloating in the abdomen. Passage of more gas than usual.  Walking can help get rid of the air that was put into your GI tract during the procedure and reduce the bloating. If you had a lower endoscopy (such as a colonoscopy or flexible sigmoidoscopy) you may notice spotting of blood in your stool or on the toilet paper. If you underwent a bowel prep for your procedure, you may not have a normal bowel movement for a few days.  Please Note:  You might notice some irritation and congestion in your nose or some drainage.  This is from the oxygen used during your procedure.  There is no need for concern and it should clear up in a day or so.  SYMPTOMS TO REPORT IMMEDIATELY:   Following lower endoscopy (colonoscopy or flexible sigmoidoscopy):  Excessive amounts of blood in the stool  Significant tenderness or worsening of abdominal pains  Swelling of the abdomen that is new, acute  Fever of 100F or higher  For urgent or emergent issues, a gastroenterologist can be reached at any hour by calling [phone].   DIET: Your first meal following the procedure should be a small meal and then it is ok to  progress to your normal diet. Heavy or fried foods are harder to digest and may make you feel nauseous or bloated.  Likewise, meals heavy in dairy and vegetables can increase bloating.  Drink plenty of fluids but you should avoid alcoholic beverages for 24 hours.  ACTIVITY:  You should plan to take it easy for the rest of today and you should NOT DRIVE or use heavy machinery until tomorrow (because of the sedation medicines used during the test).    FOLLOW UP: Our staff will call the number listed on your records the next business day following your procedure to check on you and address any questions or concerns that you may have regarding the information given to you following your procedure. If we do not reach you, we will leave a message.  However, if you are feeling well and you are not experiencing any problems, there is no need to return our call.  We will assume that you have returned to your regular daily activities without incident.  If any biopsies were taken you will be contacted by phone or by letter within the next 1-3 weeks.  Please call us at [phone] if you have not heard about the biopsies in 3 weeks.    SIGNATURES/CONFIDENTIALITY: You and/or your care partner have signed paperwork which will be entered into your electronic medical record.  These signatures attest to the fact that that the information above on your After Visit Summary has been reviewed and  is understood.  Full responsibility of the confidentiality of this discharge information lies with you and/or your care-partner.

## 2015-04-10 NOTE — Progress Notes (Signed)
Report to PACU, RN, vss, BBS= Clear.  

## 2015-04-11 ENCOUNTER — Telehealth: Payer: Self-pay | Admitting: *Deleted

## 2015-04-11 NOTE — Telephone Encounter (Signed)
  Follow up Call-  Call back number 04/10/2015  Post procedure Call Back phone  # [phone]  Permission to leave phone message Yes     Patient questions:  Do you have a fever, pain , or abdominal swelling? No. Pain Score  0 *  Have you tolerated food without any problems? Yes.    Have you been able to return to your normal activities? Yes.    Do you have any questions about your discharge instructions: Diet   No. Medications  No. Follow up visit  No.  Do you have questions or concerns about your Care? No.  Actions: * If pain score is 4 or above: No action needed, pain <4.

## 2015-05-07 ENCOUNTER — Emergency Department (INDEPENDENT_AMBULATORY_CARE_PROVIDER_SITE_OTHER)
Admission: EM | Admit: 2015-05-07 | Discharge: 2015-05-07 | Disposition: A | Discharge status: Home / Self Care | Payer: 59 | Source: Home / Self Care

## 2015-05-07 DIAGNOSIS — Z23 Encounter for immunization: Secondary | ICD-10-CM | POA: Diagnosis not present

## 2015-05-07 MED ORDER — INFLUENZA VAC SPLIT QUAD 0.5 ML IM SUSY
0.5000 mL | PREFILLED_SYRINGE | INTRAMUSCULAR | Status: AC
Start: 2015-05-07 — End: 2015-05-07
  Administered 2015-05-07: 0.5 mL via INTRAMUSCULAR

## 2015-05-07 NOTE — ED Notes (Signed)
The pt is here today for an influenza vaccine.   

## 2015-07-29 ENCOUNTER — Encounter: Payer: Self-pay | Admitting: Family Medicine

## 2015-07-29 ENCOUNTER — Ambulatory Visit (INDEPENDENT_AMBULATORY_CARE_PROVIDER_SITE_OTHER): Payer: 59 | Admitting: Family Medicine

## 2015-07-29 VITALS — BP 136/83 | HR 75 | Temp 98.2°F | Wt 229.0 lb

## 2015-07-29 DIAGNOSIS — H65192 Other acute nonsuppurative otitis media, left ear: Secondary | ICD-10-CM

## 2015-07-29 DIAGNOSIS — H65199 Other acute nonsuppurative otitis media, unspecified ear: Secondary | ICD-10-CM | POA: Insufficient documentation

## 2015-07-29 MED ORDER — AMOXICILLIN-POT CLAVULANATE 875-125 MG PO TABS: 1.0000 | ORAL_TABLET | Freq: Two times a day (BID) | ORAL | Status: DC

## 2015-07-29 MED ORDER — PREDNISONE 10 MG PO TABS: 30.0000 mg | ORAL_TABLET | Freq: Every day | ORAL | Status: DC

## 2015-07-29 NOTE — Assessment & Plan Note (Signed)
Likely due to eustachian dysfunction from recurrent viral illness. Will prednisone as well as antibiotics to treat likely infected effusion.

## 2015-07-29 NOTE — Patient Instructions (Signed)
Thank you for coming in today. You were seen for your ongoing illness and ear pressure. We think this is due to your eustachian tubes being blocked by viral illness causing swelling of the tubes. We recommend prednisone as an antiinflammatory to open these tubes up as well as antibiotics to treat infection in the ear.  Please return for continued or worsening symptoms.

## 2015-07-29 NOTE — Progress Notes (Signed)
       Jimmy Gonzalez is a 40 y.o. male who [facility] today for URI symptoms and ear pressure.  Patient has noted symptoms for last 1 month. Patient first noted nasal congestion and rhinorrhea which he notes has been off an don for over a month, the rhinorrhea subsided late last week and the nasal congestion is improving. Accompanying this is a cough productive of green mucus, which subsided late last week. Three days ago, he began to experience ear pressure, more prominent in L ear. He says this feels like he is "in the mountains or on a plane and can't get his ears to pop). 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  He has tried sudafed, dayquil, benadryl, none of which have helped. He has been eating and drinking well. He denies fevers, aches, chills, nausea, vomiting, chest pain, hemoptysis. Patient notes he had a serious ear infection in R ear as a child and is now deaf in that ear.    Past Medical History  Diagnosis Date  . Hepatitis C 1997  . Cirrhosis (HCC)     ???   Past Surgical History  Procedure Laterality Date  . External ear surgery Left [phone],1986,2003   Social History  Substance Use Topics  . Smoking status: Former Games developermoker  . Smokeless tobacco: Never Used  . Alcohol Use: No   family history includes Diabetes in his father and mother. There is no history of Colon cancer.  ROS as above Medications: Current Outpatient Prescriptions  Medication Sig Dispense Refill  . calcitRIOL (ROCALTROL) 0.25 MCG capsule Take 0.25 mcg by mouth daily. Take two tabs twice a day    . Calcium Carbonate Antacid (TUMS PO) Take by mouth.    Marland Kitchen. amoxicillin-clavulanate (AUGMENTIN) 875-125 MG tablet Take 1 tablet by mouth 2 (two) times daily. 20 tablet 0  . predniSONE (DELTASONE) 10 MG tablet Take 3 tablets (30 mg total) by  mouth daily. 15 tablet 0   No current facility-administered medications for this visit.   No Known Allergies   Exam:  BP 136/83 mmHg  Pulse 75  Temp(Src) 98.2 F (36.8 C) (Oral)  Wt 229 lb (103.874 kg)  SpO2 96% Gen: Slightly ill-appearing gentleman in NAD HEENT: EOMI,  MMM. OP without erythema or exudate. L ear: retracted TM, gray, translucent, no fluid appreciated though limited due to deviation of auditory canal R ear: scarred, opaque white and red TM, no overt signs of infection  Lungs: Normal work of breathing. CTABL no wheeze or rhonchi  Heart: RRR no MRG Abd: NABS, Soft. Nondistended, Nontender Exts: Brisk capillary refill, warm and well perfused.   No results found for this or any previous visit (from the past 24 hour(s)). No results found.  Tympanogram: Left shifted to -200 for pressure with blunted peak  Please see individual assessment and plan sections.

## 2015-07-31 DIAGNOSIS — B182 Chronic viral hepatitis C: Secondary | ICD-10-CM | POA: Diagnosis not present

## 2015-08-17 MED FILL — CALCITRIOL 0.25 MCG CAPSULE: 0.25 | 30 days supply | Qty: 120 | Fill #3

## 2015-09-16 DIAGNOSIS — E208 Other hypoparathyroidism: Secondary | ICD-10-CM | POA: Diagnosis not present

## 2015-09-17 MED FILL — CALCITRIOL 0.25 MCG CAPSULE: 0.25 | 30 days supply | Qty: 120 | Fill #0

## 2015-10-23 DIAGNOSIS — B182 Chronic viral hepatitis C: Secondary | ICD-10-CM | POA: Diagnosis not present

## 2015-10-23 MED FILL — CALCITRIOL 0.25 MCG CAPSULE: 0.25 | 30 days supply | Qty: 120 | Fill #1

## 2015-10-27 ENCOUNTER — Other Ambulatory Visit: Payer: Self-pay | Admitting: Nurse Practitioner

## 2015-10-27 DIAGNOSIS — B182 Chronic viral hepatitis C: Secondary | ICD-10-CM | POA: Diagnosis not present

## 2015-10-27 DIAGNOSIS — K740 Hepatic fibrosis: Secondary | ICD-10-CM | POA: Diagnosis not present

## 2015-10-27 DIAGNOSIS — K7460 Unspecified cirrhosis of liver: Secondary | ICD-10-CM

## 2015-10-27 DIAGNOSIS — K760 Fatty (change of) liver, not elsewhere classified: Secondary | ICD-10-CM | POA: Diagnosis not present

## 2015-10-31 ENCOUNTER — Ambulatory Visit
Admission: RE | Admit: 2015-10-31 | Discharge: 2015-10-31 | Disposition: A | Discharge status: Home / Self Care | Payer: 59 | Source: Ambulatory Visit | Attending: Nurse Practitioner | Admitting: Nurse Practitioner

## 2015-10-31 DIAGNOSIS — K7460 Unspecified cirrhosis of liver: Secondary | ICD-10-CM | POA: Diagnosis not present

## 2015-11-10 ENCOUNTER — Ambulatory Visit: Payer: 59 | Admitting: Family Medicine

## 2015-11-11 ENCOUNTER — Ambulatory Visit (INDEPENDENT_AMBULATORY_CARE_PROVIDER_SITE_OTHER): Payer: 59 | Admitting: Family Medicine

## 2015-11-11 ENCOUNTER — Encounter: Payer: Self-pay | Admitting: Family Medicine

## 2015-11-11 VITALS — BP 134/89 | HR 66 | Wt 233.0 lb

## 2015-11-11 DIAGNOSIS — H918X1 Other specified hearing loss, right ear: Secondary | ICD-10-CM | POA: Diagnosis not present

## 2015-11-11 DIAGNOSIS — H6121 Impacted cerumen, right ear: Secondary | ICD-10-CM | POA: Diagnosis not present

## 2015-11-11 NOTE — Progress Notes (Signed)
CC: Jimmy Gonzalez is a 40 y.o. male is here for Ear Pain   Subjective: HPI:  Right-sided ear fullness and hearing loss for the past month slowly worsening on a weekly basis. Symptoms were absent until he forgot to continue using vinegar in the ear canal a few times of the week. States that pain is mild hearing loss is moderate in severity. He started doing vinegar drops again yesterday but does seem to be helping. No other interventions as of yet. He has a history of cerumen impactions and this year. He denies nasal congestion, allergies, left ear pain, nor any other motor or sensory disturbances. Denies fevers, chills or headaches.   Review Of Systems Outlined In HPI  Past Medical History  Diagnosis Date  . Hepatitis C 1997  . Cirrhosis (HCC)     ???    Past Surgical History  Procedure Laterality Date  . External ear surgery Left [phone],1986,2003   Family History  Problem Relation Age of Onset  . Colon cancer Neg Hx   . Diabetes Father   . Diabetes Mother     Social History   Social History  . Marital Status: Married    Spouse Name: N/A  . Number of Children: N/A  . 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 Smoking status: Former Games developermoker  . Smokeless tobacco: Never Used  . Alcohol Use: No  . Drug Use: No     Comment: has been treated for substance abuse  . Sexual Activity:    Partners: Female   Other Topics Concern  . 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 Conjunctivae clear.  External ears unremarkable, left canal clear with intact TM with appropriate landmarks, right ear canal has a cerumen impaction on initial evaluation..   Middle ear appears open without effusion. Pink inferior turbinates.  Moist mucous membranes, pharynx without inflammation nor lesions.  Neck supple without palpable lymphadenopathy nor abnormal masses. Lungs: Clear to auscultation bilaterally, no wheezing/ronchi/rales.  Comfortable work of breathing. Good air movement. Extremities: No peripheral edema.  Strong peripheral pulses.  Mental Status: No depression, anxiety, nor agitation. Skin: Warm and dry.  Assessment & Plan: Casimiro NeedleMichael was seen today for ear pain.  Diagnoses and all orders for this visit:  Hearing loss due to cerumen impaction, right   Indication: Cerumen impaction of the ear Medical necessity statement: On physical examination, cerumen impairs clinically significant portions of the external auditory canal, and tympanic membrane. Noted obstructive, copious cerumen that cannot be removed without magnification and instrumentations requiring physician skills Consent: Discussed benefits and risks of procedure and verbal consent obtained Procedure: Patient was prepped for the procedure. Utilized an otoscope to assess and take note of the ear canal, the tympanic membrane, and the presence, amount, and placement of the cerumen. Gentle water irrigation and soft plastic curette was utilized to remove cerumen.  Post procedure examination: shows cerumen was completely removed. Patient tolerated procedure well. The patient is made aware that they may experience temporary vertigo, temporary hearing loss, and temporary discomfort. If these symptom last for more than 24 hours to  call the clinic or proceed to the ED.   Cerumen impaction was successful with restoration of his gross hearing in the right ear. Patient was symptom-free after cerumen impaction removal.  Return if symptoms worsen or fail to improve.

## 2015-11-20 MED FILL — CALCITRIOL 0.25 MCG CAPSULE: 0.25 | 30 days supply | Qty: 120 | Fill #2

## 2015-12-16 MED FILL — CALCITRIOL 0.25 MCG CAPSULE: 0.25 | 90 days supply | Qty: 360 | Fill #3

## 2015-12-24 ENCOUNTER — Ambulatory Visit (INDEPENDENT_AMBULATORY_CARE_PROVIDER_SITE_OTHER): Payer: 59 | Admitting: Family Medicine

## 2015-12-24 ENCOUNTER — Encounter: Payer: Self-pay | Admitting: Family Medicine

## 2015-12-24 VITALS — BP 123/85 | HR 72 | Wt 233.0 lb

## 2015-12-24 DIAGNOSIS — H918X1 Other specified hearing loss, right ear: Secondary | ICD-10-CM

## 2015-12-24 DIAGNOSIS — H6121 Impacted cerumen, right ear: Secondary | ICD-10-CM

## 2015-12-24 NOTE — Progress Notes (Signed)
CC: Jimmy Gonzalez is a 40 y.o. male is here for Ear Fullness and Cerumen Impaction   Subjective: HPI:  Ear fullness and hearing loss in the right ear present for the past week. No benefit from vinegar irrigation. No other interventions as of yet. Nothing seems to make symptoms better or worse. He denies any headache, dizziness, or any other motor or sensory disturbances. He's had some dry flakes of her maximum of the right ear but no other discharge. He denies any nasal congestion or sore throat. No fevers. No hearing loss in left ear   Review Of Systems Outlined In HPI  Past Medical History  Diagnosis Date  . Hepatitis C 1997  . Cirrhosis (HCC)     ???    Past Surgical History  Procedure Laterality Date  . External ear surgery Left [phone],1986,2003   Family History  Problem Relation Age of Onset  . Colon cancer Neg Hx   . Diabetes Father   . Diabetes Mother     Social History   Social History  . Marital Status: Married    Spouse Name: N/A  . Number of Children: N/A  . 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 Smoking status: Former Games developermoker  . Smokeless tobacco: Never Used  . Alcohol Use: No  . Drug Use: No     Comment: has been treated for substance abuse  . Sexual Activity:    Partners: Female   Other Topics Concern  . 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 Conjunctivae clear.  External ears unremarkable, on initial exam there is a cerumen impaction in the left ear canal, after irrigation canals clear with intact TMs with appropriate landmarks.  Middle ear appears open without effusion. Pink inferior turbinates.  Moist mucous  membranes, pharynx without inflammation nor lesions.  Neck supple without palpable lymphadenopathy nor abnormal masses. Lungs: Clear and comfortable work of breathing Cardiac: Regular rate and rhythm.  Extremities: No peripheral edema.  Strong peripheral pulses.  Mental Status: No depression, anxiety, nor agitation. Skin: Warm and dry.  Assessment & Plan: Casimiro NeedleMichael was seen today for ear fullness and cerumen impaction.  Diagnoses and all orders for this visit:  Hearing loss of right ear due to cerumen impaction   Indication: Cerumen impaction of the right ear Medical necessity statement: On physical examination, cerumen impairs clinically significant portions of the external auditory canal, and tympanic membrane. Noted obstructive, copious cerumen that cannot be removed without magnification and instrumentations requiring physician skills Consent: Discussed benefits and risks of procedure and verbal consent obtained Procedure: Patient was prepped for the procedure. Utilized an otoscope to assess and take note of the ear canal, the tympanic membrane, and the presence, amount, and placement of the cerumen. Gentle water irrigation and soft plastic curette was utilized to remove cerumen.  Post procedure examination: shows cerumen was completely removed. Patient tolerated procedure well. The patient is made aware that they may experience temporary vertigo, temporary hearing loss, and temporary discomfort. If these symptom last for more than 24 hours to call the clinic or proceed to the ED.  Hearing loss 100% resolved  Return if symptoms worsen or fail to improve.

## 2016-08-30 IMAGING — US US ABDOMEN COMPLETE W/ ELASTOGRAPHY
1 series · 13 of 25 positions shown · non-contrast
Comparison: None

CLINICAL DATA: Hepatitis-C, former smoker



[Series 1: us abdomen complete w/ elastography · 0.22mm/px · 13 of 71 slices shown]
[im 1/71]
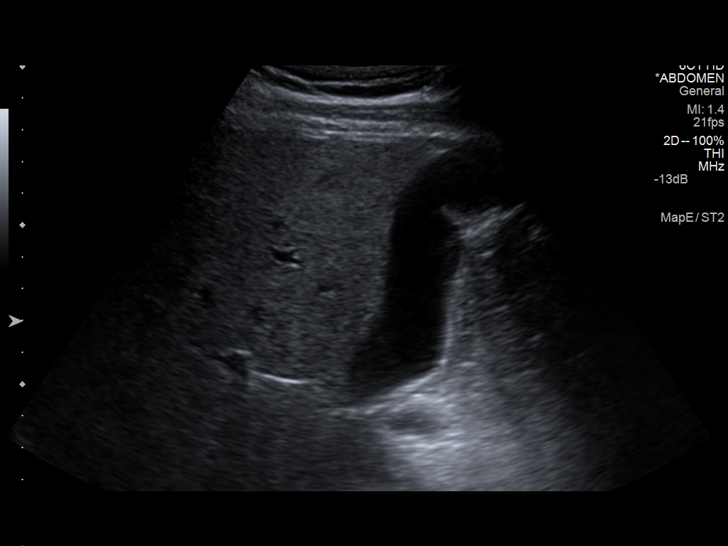
[im 6/71]
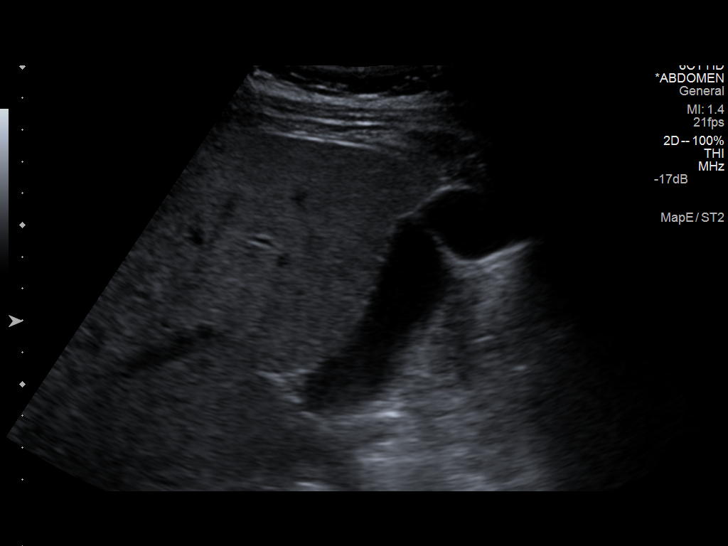
[im 12/71]
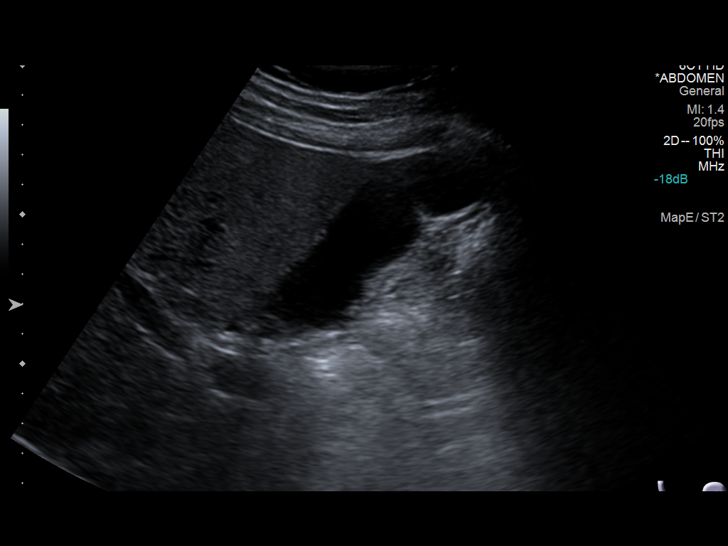
[im 18/71]
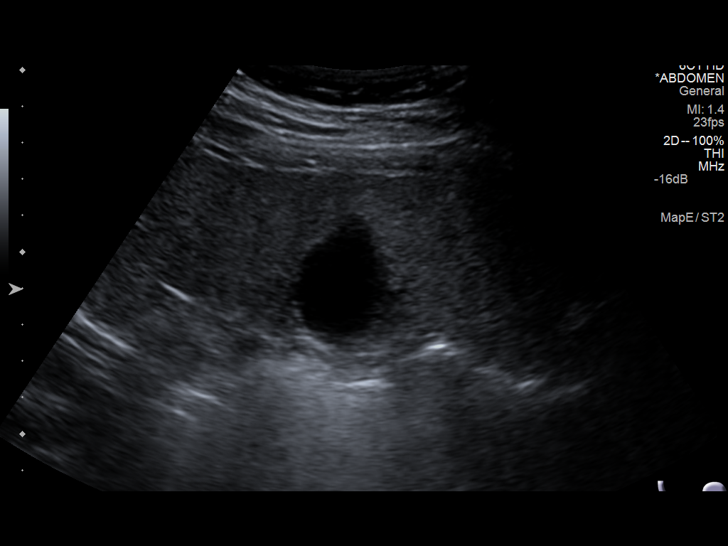
[im 24/71]
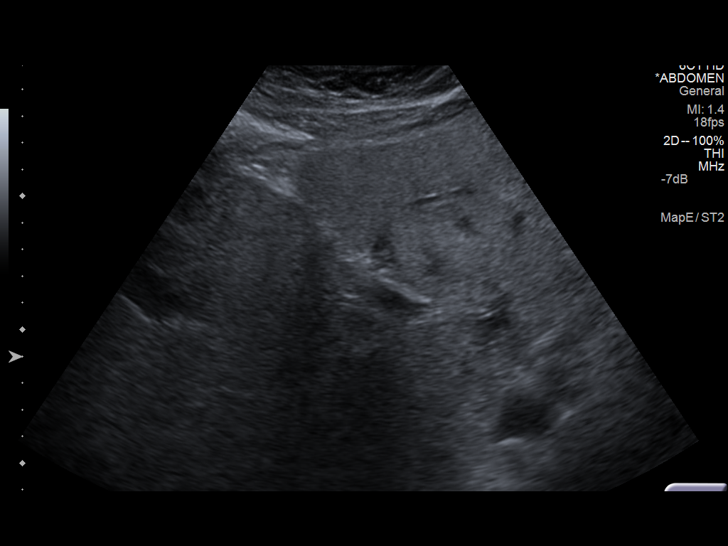
[im 30/71]
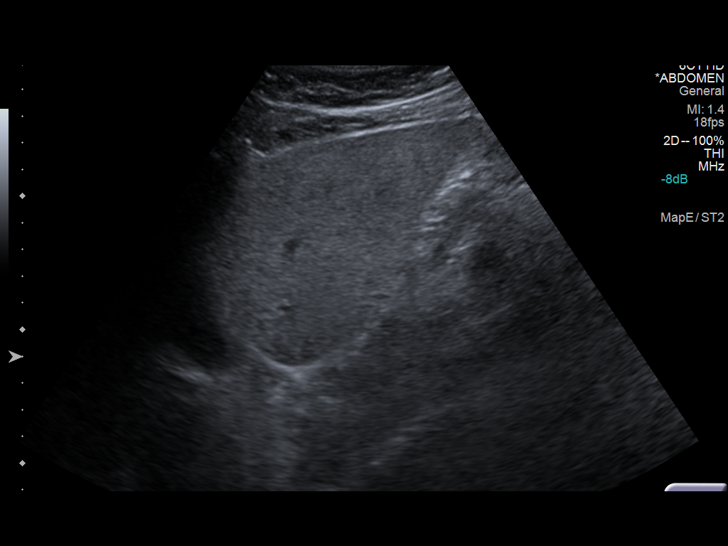
[im 36/71]
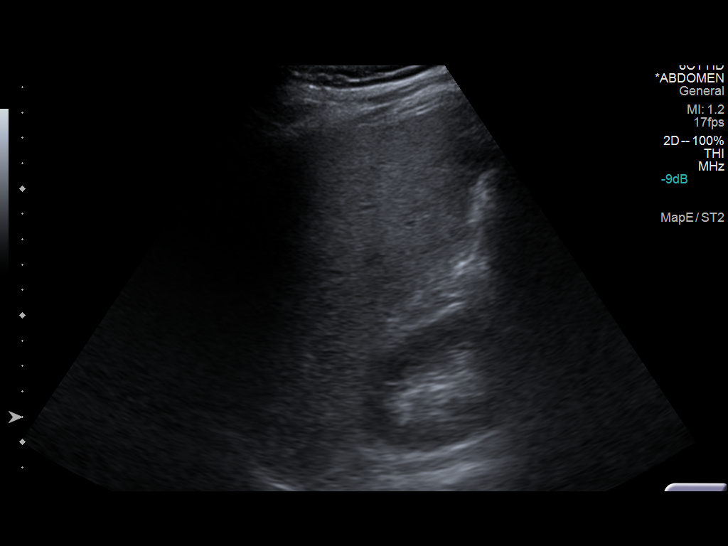
[im 41/71]
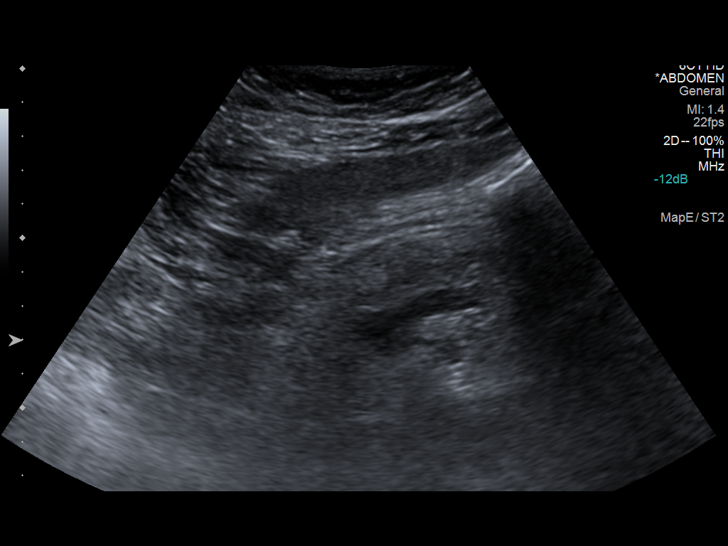
[im 47/71]
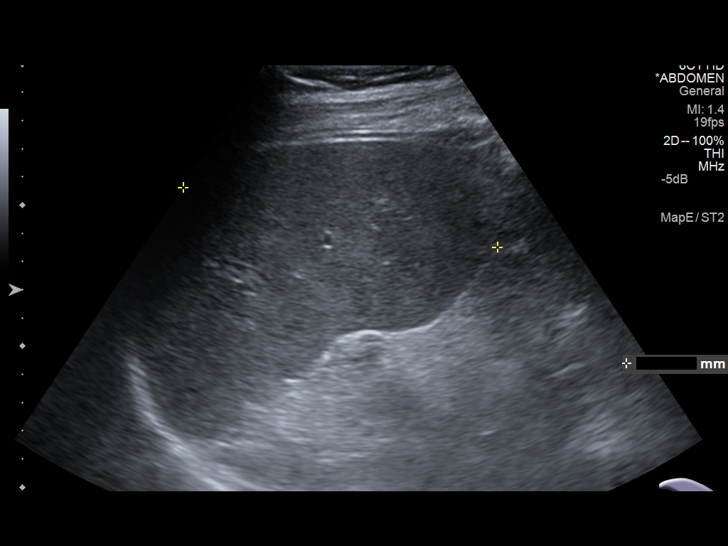
[im 53/71]
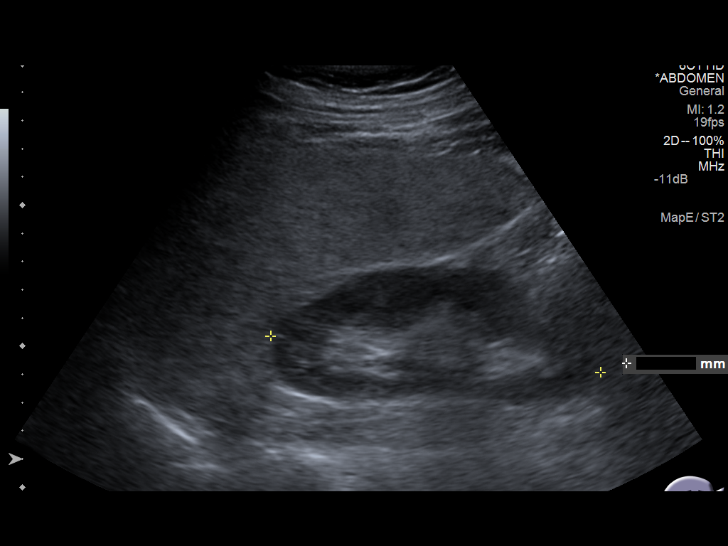
[im 59/71]
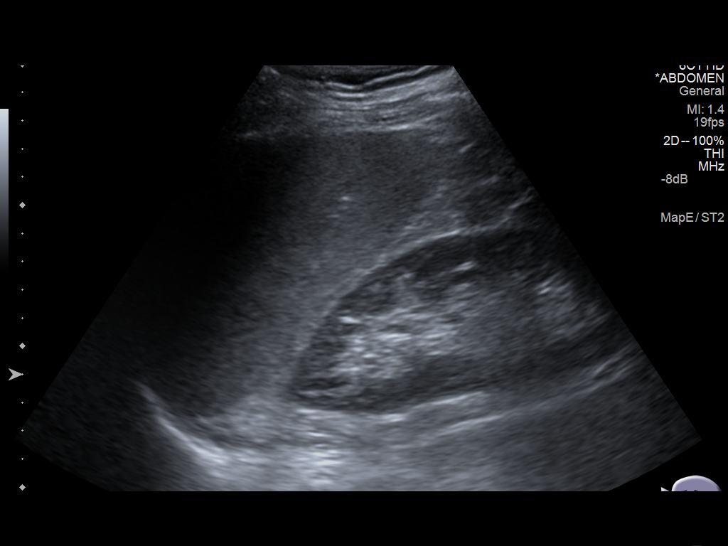
[im 65/71]
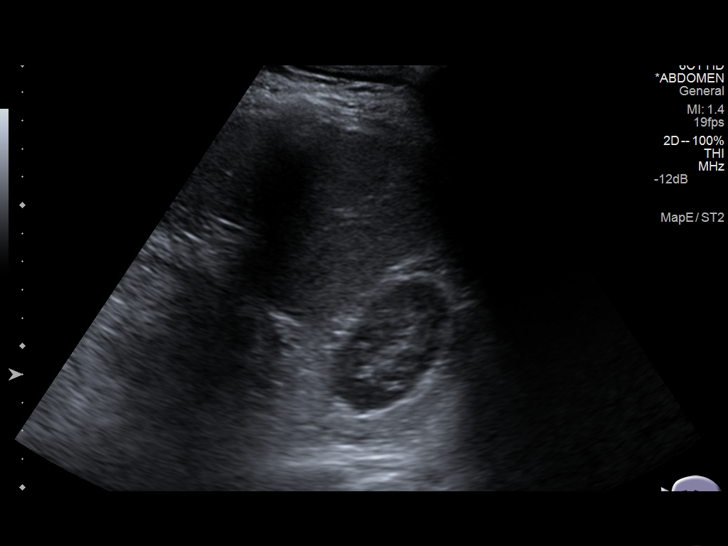
[im 71/71]
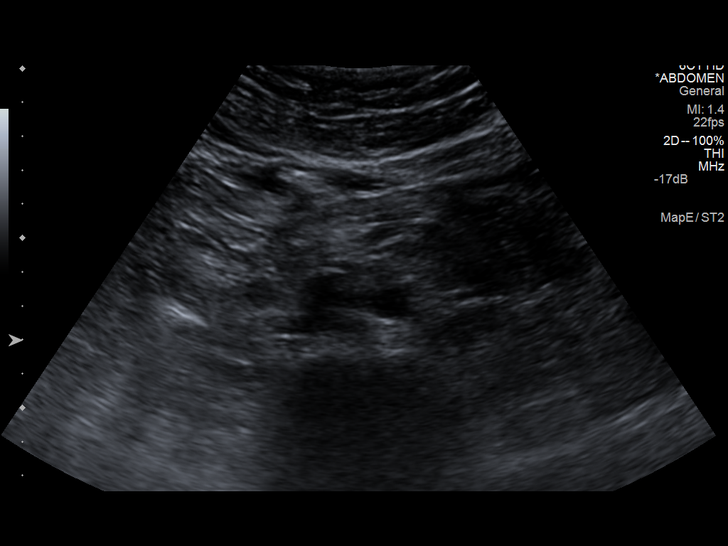

[13 of 25 positions shown; findings below may reference images not displayed]

FINDINGS: ULTRASOUND ABDOMEN

Gallbladder: Normally distended without stones or wall thickening.
Fold near fundus with probable Phyrigian cap. No pericholecystic
fluid or sonographic Murphy sign.

Common bile duct diameter: Normal 2 mm diameter

Liver: Heterogeneous and echogenic which can be seen with fatty
infiltration and cirrhosis. No focal hepatic mass or nodularity.
Hepatopetal portal venous flow.

IVC: Normal appearance

Pancreas: Normal appearance

Spleen: Upper normal size, 12.3 cm length.  No focal abnormalities.

Right Kidney: Length: 11.8 cm. Normal morphology without mass or
hydronephrosis.

Left Kidney: Length: 13.1 cm. Normal morphology without mass or
hydronephrosis.

Abdominal aorta: Normal caliber

Other findings: No free-fluid

ULTRASOUND HEPATIC ELASTOGRAPHY

Device: Siemens Helix VTQ

Transducer [id number]

Patient position: LEFT lateral decubitus

Number of measurements:  10

Hepatic Segment:  8

Median velocity:   3.27  m/sec

IQR:

IQR/Median velocity ratio

Corresponding Metavir fibrosis score:  F4 & some F3

Risk of fibrosis: High

Limitations of exam: None

Pertinent findings noted on other imaging exams:  As above

Please note that abnormal shear wave velocities may also be
identified in clinical settings other than with hepatic fibrosis,
such as: acute hepatitis, elevated right heart and central venous
pressures including use of beta blockers, Itahisa disease
(Aujla), infiltrative processes such as
mastocytosis/amyloidosis/infiltrative tumor, extrahepatic
cholestasis, in the post-prandial state, and liver transplantation.
Correlation with patient history, laboratory data, and clinical
condition recommended.
IMPRESSION: Echogenic liver which can be seen with cirrhosis and fatty
infiltration.

No discrete hepatic mass or biliary dilatation identified.

Upper normal spleen size 12.3 cm length

Median hepatic shear wave velocity is calculated at 3.27 m/sec.

Corresponding Metavir fibrosis score is F4 & some F3.

Risk of fibrosis is high.

Follow-up:  Advised

## 2017-08-03 IMAGING — US US ABDOMEN LIMITED
1 series · 14 of 25 positions shown · non-contrast
Comparison: Abdominal ultrasound - 11/27/2014

CLINICAL DATA: History of cirrhosis

EXAM:
US ABDOMEN LIMITED - RIGHT UPPER QUADRANT

[Series 1: us abdomen limited · 0.32mm/px · 14 of 54 slices shown]
[im 1/54]
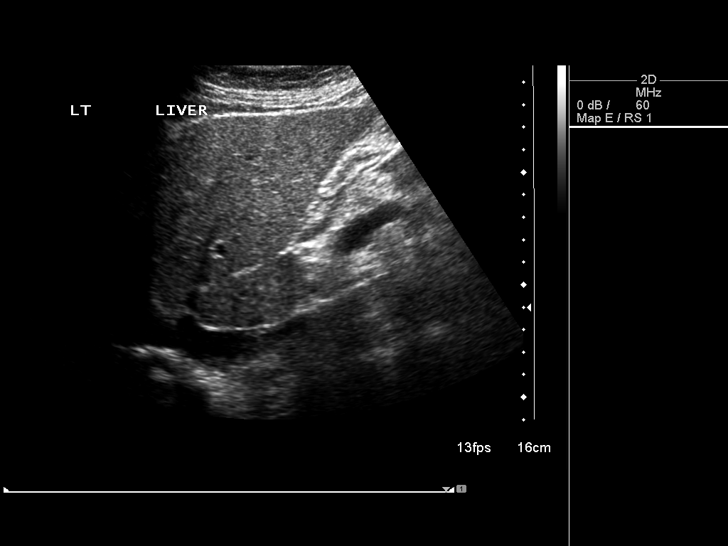
[im 5/54]
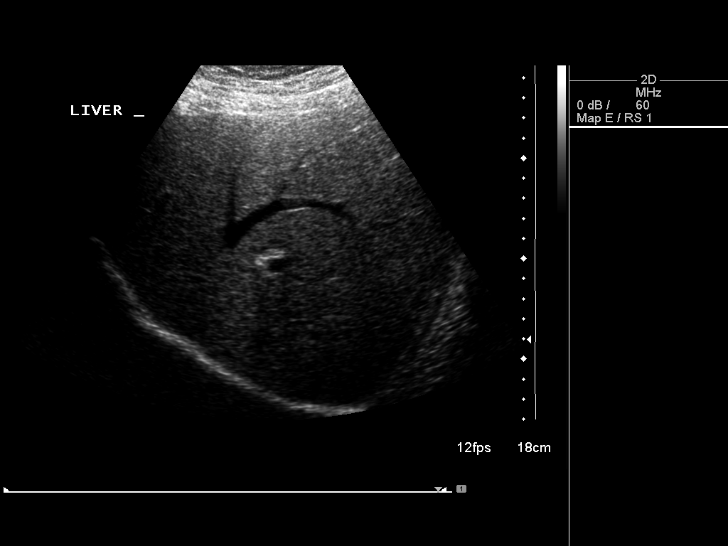
[im 9/54]
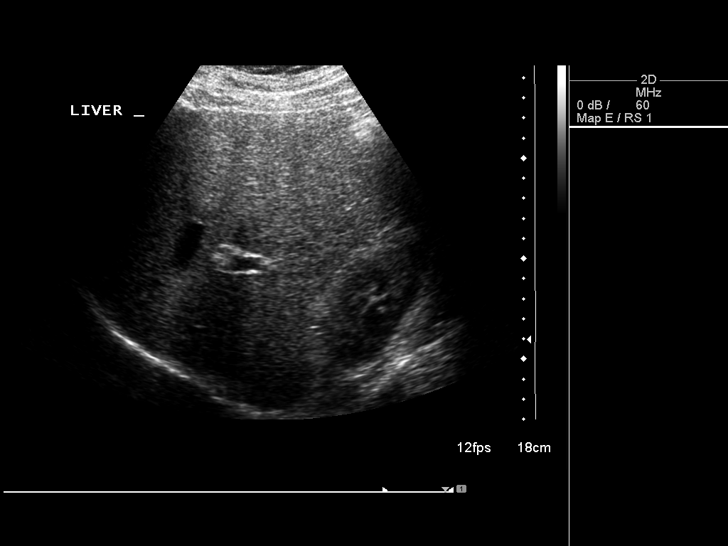
[im 14/54]
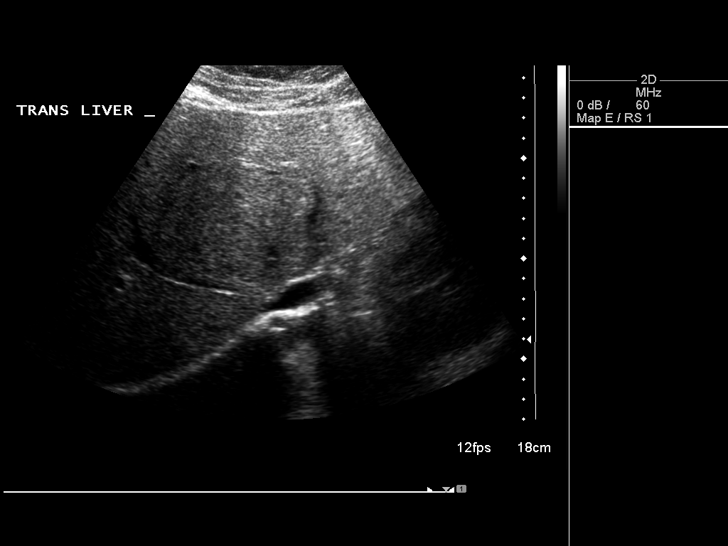
[im 18/54]
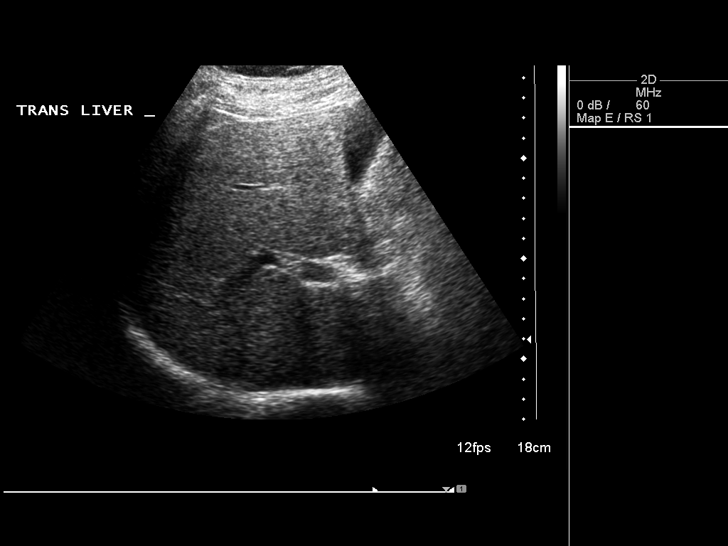
[im 20/54]
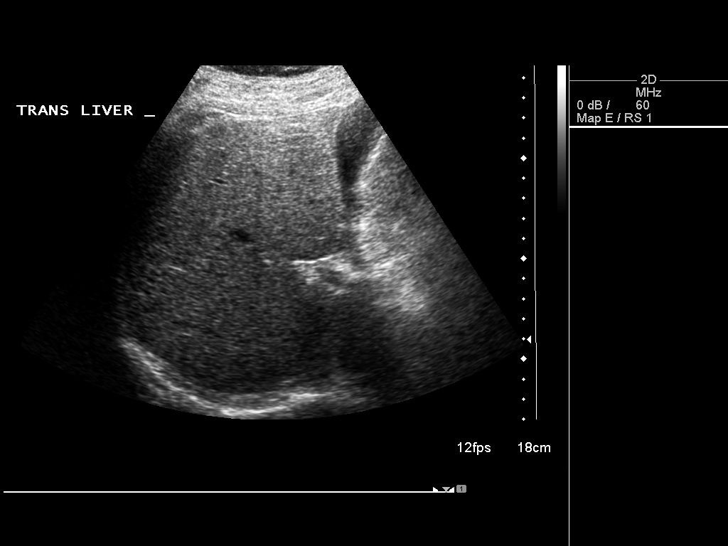
[im 25/54]
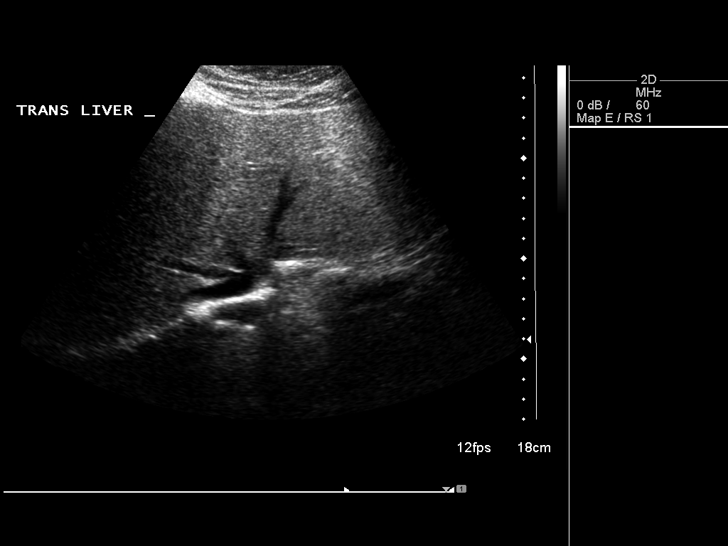
[im 29/54]
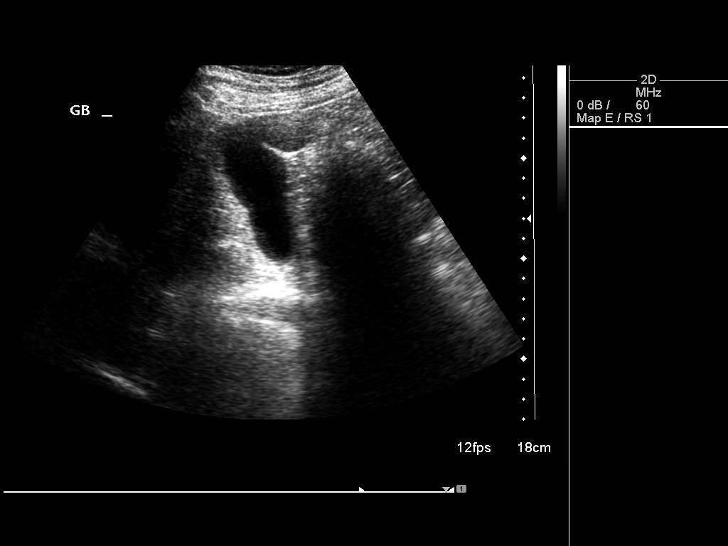
[im 34/54]
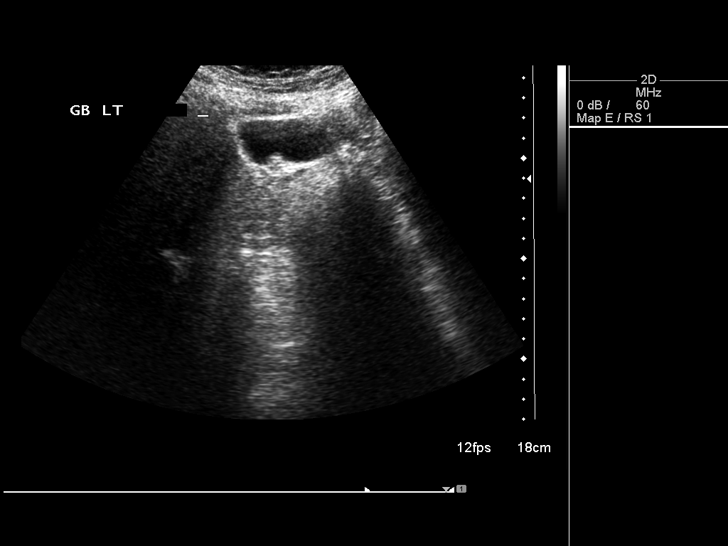
[im 36/54]
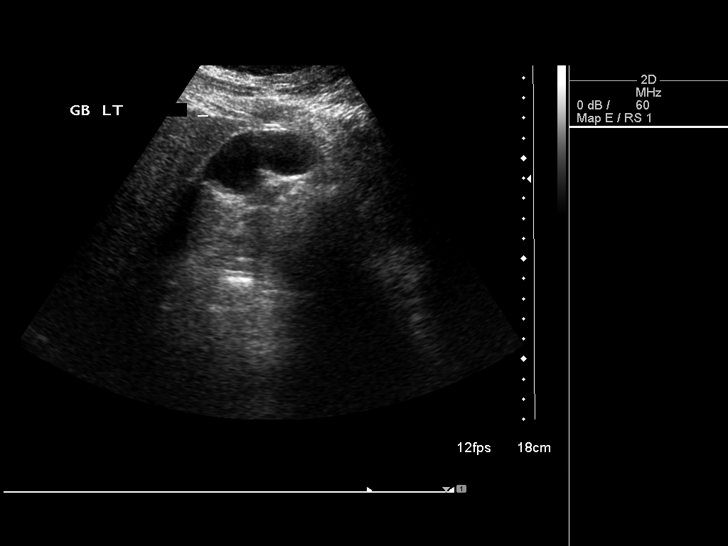
[im 40/54]
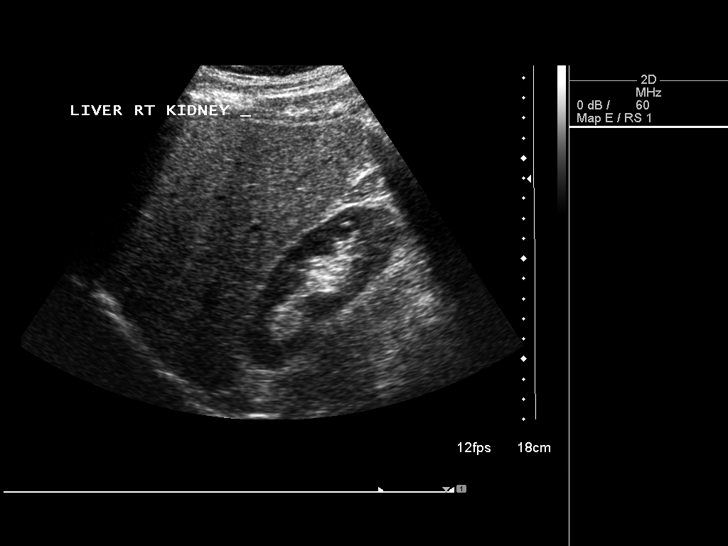
[im 45/54]
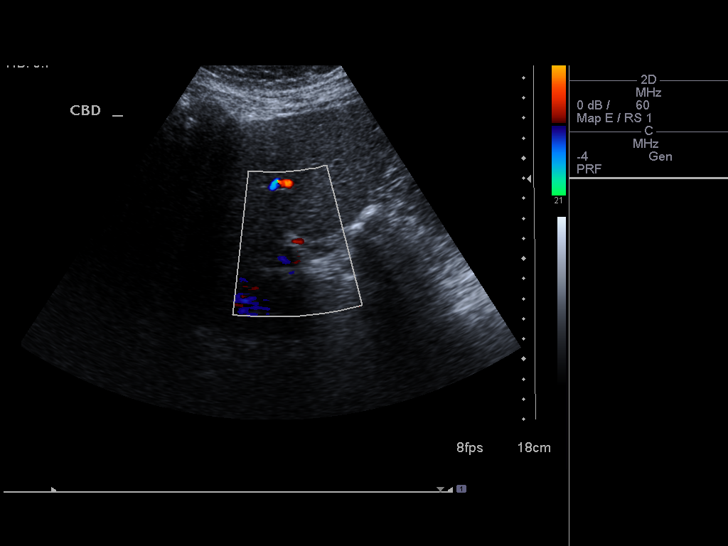
[im 49/54]
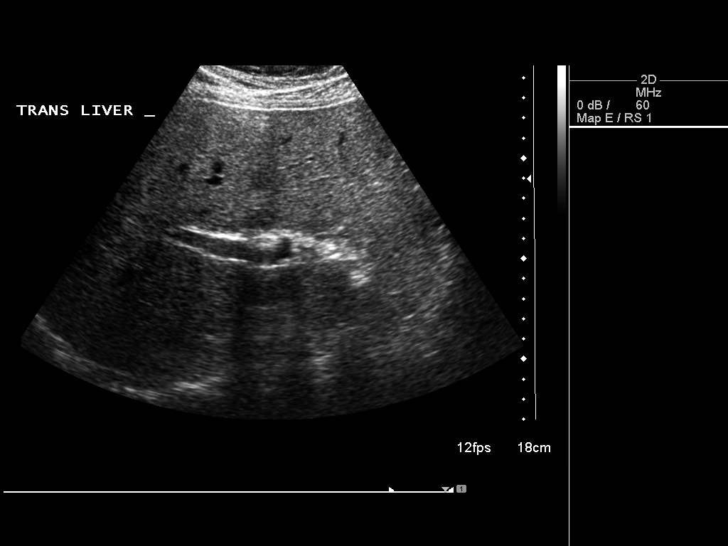
[im 54/54]
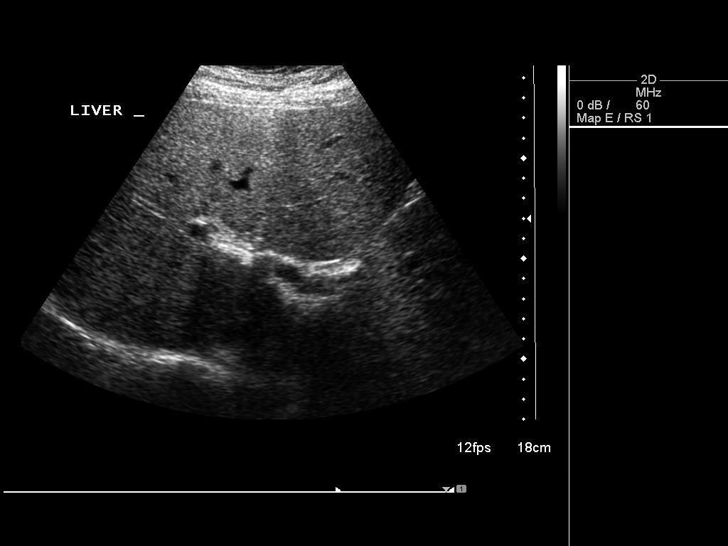

[14 of 25 positions shown; findings below may reference images not displayed]

FINDINGS: Gallbladder:

Sonographically normal. No echogenic gallstones or gall sludge. No
gallbladder wall thickening or pericholecystic fluid. Note is made
of a phrygian cap (representative images 34 and 52). Negative
sonographic Murphy's sign.

Common bile duct:

Diameter: Normal in size measuring 4.6 mm in diameter

Liver:

Unchanged mild diffuse slightly coarsened echogenicity of the
hepatic parenchyma (representative image 42). No discrete hepatic
lesions. No intrahepatic biliary duct dilatation. No ascites.
IMPRESSION: Unchanged findings suggestive of hepatic steatosis.

## 2018-07-31 ENCOUNTER — Encounter: Payer: Self-pay | Admitting: Internal Medicine
